# Patient Record
Sex: Male | Born: 2005 | Race: Black or African American | Hispanic: No | Marital: Single | Smoking: Never smoker
Health system: Southern US, Community
[De-identification: ages and names within clinical notes are randomized; demographics above are authoritative.]

## PROBLEM LIST (undated history)

## (undated) DIAGNOSIS — Z789 Other specified health status: Secondary | ICD-10-CM

---
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
## 2023-02-08 DIAGNOSIS — Z111 Encounter for screening for respiratory tuberculosis: Secondary | ICD-10-CM | POA: Diagnosis not present

## 2023-02-10 DIAGNOSIS — Z111 Encounter for screening for respiratory tuberculosis: Secondary | ICD-10-CM | POA: Diagnosis not present

## 2024-01-30 ENCOUNTER — Telehealth: Payer: Self-pay | Admitting: *Deleted

## 2024-01-30 NOTE — Telephone Encounter (Signed)
 Copied from CRM [id number]. Topic: Appointments - Scheduling Inquiry for Clinic >> Jan 26, 2024 12:03 PM Chiquita SQUIBB wrote: Reason for CRM: Patients father is calling in asking if the patient can be added on for Dr. Kennyth, as the rest of the family is patients of Dr. Kennyth. Patient is experiencing a enlarged heart muscle and needs a pcp to do the follow up testing. Please advise the patients dad if this is something that can be done or if he needs to schedule with a different provider. Dads number is the main number in the chart.

## 2024-01-31 NOTE — Telephone Encounter (Signed)
Please see Dr. Marigene Ehlers message and schedule.

## 2024-01-31 NOTE — Telephone Encounter (Signed)
 That is ok to schedule as a new PCP however it sounds like it is probably a good idea for him to establish with a cardiologist.   Worth HERO. Kennyth, MD 01/31/2024 7:44 AM

## 2024-02-10 ENCOUNTER — Ambulatory Visit (INDEPENDENT_AMBULATORY_CARE_PROVIDER_SITE_OTHER): Payer: Self-pay | Admitting: Family Medicine

## 2024-02-10 ENCOUNTER — Encounter: Payer: Self-pay | Admitting: Family Medicine

## 2024-02-10 VITALS — BP 130/74 | HR 61 | Temp 98.2°F | Ht 76.0 in | Wt 166.6 lb

## 2024-02-10 DIAGNOSIS — I421 Obstructive hypertrophic cardiomyopathy: Secondary | ICD-10-CM | POA: Diagnosis not present

## 2024-02-10 DIAGNOSIS — H9319 Tinnitus, unspecified ear: Secondary | ICD-10-CM | POA: Insufficient documentation

## 2024-02-10 NOTE — Assessment & Plan Note (Signed)
 Patient with tinnitus for the last few months.  No obvious abnormalities on exam today.  Reassuring neurologic exam.  We did discuss hearing test and potentially referral to ENT however he would like to hold off on this for now.  He will let us  know if he changes mind or if he develops any other symptoms.

## 2024-02-10 NOTE — Patient Instructions (Addendum)
 It was very nice to see you today!  I will refer you to see the cardiologist.  Please avoid any strenuous activity until you see them.  Will see back in year for your physical.  Come back sooner if needed.  Return in about 1 year (around 02/09/2025) for Annual Physical.   Take care, Dr Kennyth  PLEASE NOTE:  If you had any lab tests, please let us  know if you have not heard back within a few days. You may see your results on mychart before we have a chance to review them but we will give you a call once they are reviewed by us .   If we ordered any referrals today, please let us  know if you have not heard from their office within the next week.   If you had any urgent prescriptions sent in today, please check with the pharmacy within an hour of our visit to make sure the prescription was transmitted appropriately.   Please try these tips to maintain a healthy lifestyle:  Eat at least 3 REAL meals and 1-2 snacks per day.  Aim for no more than 5 hours between eating.  If you eat breakfast, please do so within one hour of getting up.   Each meal should contain half fruits/vegetables, one quarter protein, and one quarter carbs (no bigger than a computer mouse)  Cut down on sweet beverages. This includes juice, soda, and sweet tea.   Drink at least 1 glass of water with each meal and aim for at least 8 glasses per day  Exercise at least 150 minutes every week.

## 2024-02-10 NOTE — Progress Notes (Signed)
 Dwayne Gutierrez is a 18 y.o. male who presents today for an office visit.  He is here as a new patient.   Assessment/Plan:  Chronic Problems Addressed Today: HOCM (hypertrophic obstructive cardiomyopathy) (HCC) He was recently diagnosed with this by cardiology in [place] .  Do not have records available to review.  Apparently he was recommended to have further evaluation including MRI and stress testing however the physicians in [place]  were out of network.  We will refer him to a local cardiologist for further evaluation.  He is not currently having any red flag signs or symptoms though did discuss importance of avoiding strenuous activity until he completes his cardiac workup.  Tinnitus Patient with tinnitus for the last few months.  No obvious abnormalities on exam today.  Reassuring neurologic exam.  We did discuss hearing test and potentially referral to ENT however he would like to hold off on this for now.  He will let us  know if he changes mind or if he develops any other symptoms.     Subjective:  HPI:  Patient is here today as a new patient to establish care.  His primary concern today is recent diagnosis of hypertrophic cardiomyopathy.  Patient currently is in school in [place]  at [employer].  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  As part of his evaluation he underwent EKG which showed an abnormality that he is not sure of.  He was then subsequently referred to a cardiologist in [place]  and was told that he had hypertrophic cardiomyopathy.  They did recommend further evaluation including cardiac MRI and stress testing however the providers in [place] were not covered under his insurance plan and he would like to be referred to a local cardiologist.  Overall he has not had any symptoms with this.  He has played soccer for many years without any issues with chest pain or excessive shortness of breath.  No syncopal  episodes.  Also does report ringing in his ear for the last several months.  This started when he had a cold a few months ago.  No specific treatment tried for this.  No injuries.  No loud noise exposures.   ROS: Per HPI, otherwise a complete review of systems was negative.   PMH:  The following were reviewed and entered/updated in epic: Past Medical History:  Diagnosis Date   Medical history non-contributory    Patient Active Problem List   Diagnosis Date Noted   HOCM (hypertrophic obstructive cardiomyopathy) (HCC) 02/10/2024   Tinnitus 02/10/2024   Past Surgical History:  Procedure Laterality Date   CIRCUMCISION N/A 11/16/2012   Procedure: CIRCUMCISION PEDIATRIC;  Surgeon: CHRISTELLA. Julietta Millman, MD;  Location: [facility];  Service: Pediatrics;  Laterality: N/A;    Family History  Adopted: Yes    Medications- reviewed and updated No current outpatient medications on file.   No current facility-administered medications for this visit.    Allergies-reviewed and updated No Known Allergies  Social History   Socioeconomic History   Marital status: Single    Spouse name: Not on file   Number of children: Not on file   Years of education: Not on file   Highest education level: Not on file  Occupational History   Not on file  Tobacco Use   Smoking status: Never   Smokeless tobacco: Never  Substance and Sexual Activity   Alcohol use: No   Drug use: No   Sexual activity: Not  on file  Other Topics Concern   Not on file  Social History Narrative   Lives with mom, dad and siblings. He is in the 9th grade at [employer]   Social Drivers of Health   Financial Resource Strain: Not on file  Food Insecurity: Not on file  Transportation Needs: Not on file  Physical Activity: Not on file  Stress: Not on file  Social Connections: Not on file          Objective:  Physical Exam: BP 130/74   Pulse 61   Temp 98.2 F (36.8 C)   Ht 6' 4 (1.93 m)    Wt 166 lb 9.6 oz (75.6 kg)   SpO2 96%   BMI 20.28 kg/m   Gen: No acute distress, resting comfortably HEENT: tympanic membranes clear bilaterally CV: Regular rate and rhythm with 2/6 systolic murmur appreciated Pulm: Normal work of breathing, clear to auscultation bilaterally with no crackles, wheezes, or rhonchi Neuro: Grossly normal, moves all extremities Psych: Normal affect and thought content      Dwayne Steffensmeier M. Kennyth, MD 02/10/2024 1:55 PM

## 2024-02-10 NOTE — Assessment & Plan Note (Signed)
 He was recently diagnosed with this by cardiology in [place] .  Do not have records available to review.  Apparently he was recommended to have further evaluation including MRI and stress testing however the physicians in [place]  were out of network.  We will refer him to a local cardiologist for further evaluation.  He is not currently having any red flag signs or symptoms though did discuss importance of avoiding strenuous activity until he completes his cardiac workup.

## 2024-06-10 NOTE — Progress Notes (Unsigned)
 Cardiology Office Note:  .   Date:  06/12/2024  ID:  Patti GORMAN Jenny, DOB [birth year], MRN [id number] PCP: Kennyth Worth HERO, MD  [facility] Providers Cardiologist:  Darryle ONEIDA Decent, MD    History of Present Illness: .    Chief Complaint  Patient presents with   Cardiomyopathy         DARRIE MACMILLAN is a 18 y.o. male with history of HCM who presents for the evaluation of HCM at the request of Kennyth, Worth HERO, MD.   History of Present Illness   Jonathyn CAPRI RABEN is a 18 year old male with hypertrophic cardiomyopathy who presents for further evaluation.  He was recently diagnosed with hypertrophic cardiomyopathy after an abnormal EKG was noted during a physical examination for joining the soccer team at [employer]. The EKG was reported as abnormal, leading to further evaluation by a cardiologist at [facility]. An echocardiogram confirmed concerns for hypertrophic cardiomyopathy.  He denies regular symptoms such as chest pain, dyspnea, dizziness, or lightheadedness. However, he recalls a single episode of palpitations and fatigue during a pickup soccer game, which occurred within five minutes of starting to run without warming up. This episode has not recurred.  He has not yet undergone a cardiac MRI due to network issues but was advised to have one along with a stress test. He is not currently on any medications and has no history of surgeries. His blood pressure has been consistently slightly elevated, but he is not on any treatment for it.  His family history is unknown as he is adopted. He denies smoking, uses alcohol minimally, and does not use recreational drugs. 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          Problem List HCM?    ROS: All other ROS reviewed and negative. Pertinent positives noted in the HPI.     Studies Reviewed: SABRA   EKG Interpretation Date/Time:  Tuesday June 12 2024 09:18:38 EST Ventricular Rate:  43 PR  Interval:  194 QRS Duration:  100 QT Interval:  416 QTC Calculation: 351 R Axis:   82  Text Interpretation: Marked sinus bradycardia Early repolarization pattern Confirmed by Decent Darryle [phone]) on 06/12/2024 9:22:59 AM   Physical Exam:   VS:  BP (!) 144/78 (BP Location: Left Arm, Patient Position: Sitting, Cuff Size: Normal)   Pulse (!) 43   Ht 6' 4 (1.93 m)   Wt 175 lb 3.2 oz (79.5 kg)   SpO2 98%   BMI 21.33 kg/m    Wt Readings from Last 3 Encounters:  06/12/24 175 lb 3.2 oz (79.5 kg)  02/10/24 166 lb 9.6 oz (75.6 kg)  08/07/19 137 lb 9.1 oz (62.4 kg) (60%, Z= 0.24)*   * Growth percentiles are based on CDC (Boys, 2-20 Years) data.    GEN: Well nourished, well developed in no acute distress NECK: No JVD; No carotid bruits CARDIAC: RRR, no murmurs, rubs, gallops RESPIRATORY:  Clear to auscultation without rales, wheezing or rhonchi  ABDOMEN: Soft, non-tender, non-distended EXTREMITIES:  No edema; No deformity  ASSESSMENT AND PLAN: .   Assessment and Plan    Hypertrophic cardiomyopathy (suspected, under evaluation) Suspected based on abnormal EKG and echocardiogram. No symptoms like chest pain, dyspnea, or syncope. One episode of palpitations and fatigue. No family history due to adoption. Slightly elevated blood pressure, not on medication. MRI needed for confirmation before stress test. - Ordered cardiac MRI to confirm diagnosis. - Performed  blood work for MRI preparation. - Scheduled follow-up on December 22nd, 2025, at 8 AM. - Advised light activity is likely safe until further evaluation.              Follow-up: Return in about 5 weeks (around 07/17/2024).  Signed, Darryle DASEN. Barbaraann, MD, [facility]  [facility]  [address] [place], [place] [address] [phone]  9:39 AM

## 2024-06-12 ENCOUNTER — Encounter: Payer: Self-pay | Admitting: Cardiovascular Disease

## 2024-06-12 ENCOUNTER — Ambulatory Visit: Attending: Cardiovascular Disease | Admitting: Cardiovascular Disease

## 2024-06-12 VITALS — BP 144/78 | HR 43 | Ht 76.0 in | Wt 175.2 lb

## 2024-06-12 DIAGNOSIS — I422 Other hypertrophic cardiomyopathy: Secondary | ICD-10-CM

## 2024-06-12 NOTE — Patient Instructions (Signed)
 Medication Instructions:  Your physician recommends that you continue on your current medications as directed. Please refer to the Current Medication list given to you today.  *If you need a refill on your cardiac medications before your next appointment, please call your pharmacy*  Lab Work: Today: CBC If you have labs (blood work) drawn today and your tests are completely normal, you will receive your results only by: MyChart Message (if you have MyChart) OR A paper copy in the mail If you have any lab test that is abnormal or we need to change your treatment, we will call you to review the results.  Testing/Procedures: Cardiac MRI Your physician has requested that you have a cardiac MRI. Cardiac MRI uses a computer to create images of your heart as its beating, producing both still and moving pictures of your heart and major blood vessels. For further information please visit [email or web address]. Please follow the instruction sheet given to you today for more information.   Follow-Up: At [facility], you and your health needs are our priority.  As part of our continuing mission to provide you with exceptional heart care, our providers are all part of one team.  This team includes your primary Cardiologist (physician) and Advanced Practice Providers or APPs (Physician Assistants and Nurse Practitioners) who all work together to provide you with the care you need, when you need it.  Your next appointment:   07/16/2024  at 8am  Provider:   Darryle ONEIDA Decent, MD    We recommend signing up for the patient portal called MyChart.  Sign up information is provided on this After Visit Summary.  MyChart is used to connect with patients for Virtual Visits (Telemedicine).  Patients are able to view lab/test results, encounter notes, upcoming appointments, etc.  Non-urgent messages can be sent to your provider as well.   To learn more about what you can do with MyChart, go to  [email or web address].   Other Instructions None

## 2024-06-13 ENCOUNTER — Ambulatory Visit: Payer: Self-pay | Admitting: Cardiovascular Disease

## 2024-06-13 LAB — CBC
Hematocrit: 50.5 % (ref 37.5–51.0)
Hemoglobin: 16.9 g/dL (ref 13.0–17.7)
MCH: 31.7 pg (ref 26.6–33.0)
MCHC: 33.5 g/dL (ref 31.5–35.7)
MCV: 95 fL (ref 79–97)
Platelets: 186 x10E3/uL (ref 150–450)
RBC: 5.33 x10E6/uL (ref 4.14–5.80)
RDW: 12.2 % (ref 11.6–15.4)
WBC: 3.3 x10E3/uL — ABNORMAL LOW (ref 3.4–10.8)

## 2024-06-15 ENCOUNTER — Telehealth: Payer: Self-pay

## 2024-06-15 NOTE — Telephone Encounter (Signed)
 I called [facility] in [place] contact number [phone] to request the echocardiogram results.  I left a message with the medical records department.

## 2024-06-20 ENCOUNTER — Ambulatory Visit (HOSPITAL_COMMUNITY)

## 2024-07-04 NOTE — Progress Notes (Deleted)
°  Cardiology Office Note:  .   Date:  07/04/2024  ID:  DIONYSIOS MASSMAN, DOB 2005/10/02, MRN [id number] PCP: Kennyth Worth HERO, MD  [facility] Providers Cardiologist:  Darryle ONEIDA Decent, MD   History of Present Illness: .   No chief complaint on file.   Dwayne Gutierrez is a 18 y.o. male with below history who presents for follow-up.   History of Present Illness              *** Problem List HCM?    ROS: All other ROS reviewed and negative. Pertinent positives noted in the HPI.     Studies Reviewed: SABRA       Physical Exam:   VS:  There were no vitals taken for this visit.   Wt Readings from Last 3 Encounters:  06/12/24 175 lb 3.2 oz (79.5 kg)  02/10/24 166 lb 9.6 oz (75.6 kg)  08/07/19 137 lb 9.1 oz (62.4 kg) (60%, Z= 0.24)*   * Growth percentiles are based on CDC (Boys, 2-20 Years) data.    GEN: Well nourished, well developed in no acute distress NECK: No JVD; No carotid bruits CARDIAC: ***RRR, no murmurs, rubs, gallops RESPIRATORY:  Clear to auscultation without rales, wheezing or rhonchi  ABDOMEN: Soft, non-tender, non-distended EXTREMITIES:  No edema; No deformity  ASSESSMENT AND PLAN: .   Assessment and Plan                 {Are you ordering a CV Procedure (e.g. stress test, cath, DCCV, TEE, etc)?   Press F2        :[id number]}   Follow-up: No follow-ups on file.  Signed, Darryle ONEIDA. Decent, MD, [facility]  [facility]  [address] [place], [place] [address] [phone]  4:11 PM

## 2024-07-06 ENCOUNTER — Other Ambulatory Visit: Payer: Self-pay | Admitting: Cardiovascular Disease

## 2024-07-06 ENCOUNTER — Ambulatory Visit (HOSPITAL_COMMUNITY): Admission: RE | Admit: 2024-07-06 | Discharge: 2024-07-06 | Attending: Cardiovascular Disease

## 2024-07-06 DIAGNOSIS — I422 Other hypertrophic cardiomyopathy: Secondary | ICD-10-CM | POA: Diagnosis not present

## 2024-07-06 MED ORDER — GADOBUTROL 1 MMOL/ML IV SOLN
10.5000 mL | Freq: Once | INTRAVENOUS | Status: AC | PRN
  Administered 2024-07-06: 10.5 mL via INTRAVENOUS

## 2024-07-09 ENCOUNTER — Telehealth: Payer: Self-pay | Admitting: *Deleted

## 2024-07-09 NOTE — Telephone Encounter (Signed)
 Copied from CRM #[id number]. Topic: Clinical - Medical Advice >> Jul 05, 2024  5:08 PM Taleah C wrote: Reason for CRM: pt called in and asked if a nurse could clarify the type of mri he is having tomorrow.  MRI order by Dr Lalla  Done on 07/06/2024 [facility]

## 2024-07-16 ENCOUNTER — Ambulatory Visit: Admitting: Cardiovascular Disease

## 2024-08-07 NOTE — Progress Notes (Signed)
 Mr. Offord had difficulty seeing us  due to insurance issues.  He was referred to us  for concerns for hypertrophic cardiomyopathy.  Outside echocardiogram possibly showed this.  He underwent a cardiac MRI at [facility] which was normal.  There is no evidence of hypertrophic cardiomyopathy.  He may proceed to sport and full activity without restrictions.  He will see us  back as needed.  We provided a letter for him today.  He will not be charged for a visit today.  We simply provided this letter for him.  Signed, Darryle DASEN. Barbaraann, MD, [facility]  [facility]  [address] [place], [place] [address] [phone]  9:21 AM

## 2024-08-09 ENCOUNTER — Telehealth: Payer: Self-pay | Admitting: Cardiovascular Disease

## 2024-08-09 NOTE — Telephone Encounter (Signed)
 Pt calling to ask if 1/21 appt can be phone appt. Please advise.

## 2024-08-09 NOTE — Telephone Encounter (Signed)
LMTCB 1/15

## 2024-08-15 ENCOUNTER — Encounter: Payer: Self-pay | Admitting: Cardiovascular Disease

## 2024-08-15 ENCOUNTER — Ambulatory Visit (INDEPENDENT_AMBULATORY_CARE_PROVIDER_SITE_OTHER): Payer: Self-pay | Admitting: Cardiovascular Disease

## 2024-08-15 VITALS — BP 128/70 | HR 43 | Ht 76.0 in

## 2024-08-15 DIAGNOSIS — R931 Abnormal findings on diagnostic imaging of heart and coronary circulation: Secondary | ICD-10-CM
